# Patient Record
Sex: Female | Born: 1960 | Race: Black or African American | Hispanic: No | Marital: Married | Smoking: Never smoker
Health system: Southern US, Community
[De-identification: ages and names within clinical notes are randomized; demographics above are authoritative.]

## PROBLEM LIST (undated history)

## (undated) DIAGNOSIS — B356 Tinea cruris: Secondary | ICD-10-CM

## (undated) DIAGNOSIS — IMO0002 Reserved for concepts with insufficient information to code with codable children: Secondary | ICD-10-CM

## (undated) DIAGNOSIS — O24419 Gestational diabetes mellitus in pregnancy, unspecified control: Secondary | ICD-10-CM

## (undated) DIAGNOSIS — Z975 Presence of (intrauterine) contraceptive device: Secondary | ICD-10-CM

## (undated) DIAGNOSIS — D649 Anemia, unspecified: Secondary | ICD-10-CM

## (undated) HISTORY — DX: Presence of (intrauterine) contraceptive device: Z97.5

## (undated) HISTORY — DX: Tinea cruris: B35.6

## (undated) HISTORY — DX: Gestational diabetes mellitus in pregnancy, unspecified control: O24.419

## (undated) HISTORY — DX: Anemia, unspecified: D64.9

## (undated) HISTORY — DX: Reserved for concepts with insufficient information to code with codable children: IMO0002

---

## 1987-07-23 DIAGNOSIS — IMO0002 Reserved for concepts with insufficient information to code with codable children: Secondary | ICD-10-CM

## 1987-07-23 DIAGNOSIS — R87619 Unspecified abnormal cytological findings in specimens from cervix uteri: Secondary | ICD-10-CM

## 1987-07-23 HISTORY — DX: Unspecified abnormal cytological findings in specimens from cervix uteri: R87.619

## 1987-07-23 HISTORY — DX: Reserved for concepts with insufficient information to code with codable children: IMO0002

## 1998-06-08 ENCOUNTER — Observation Stay (HOSPITAL_COMMUNITY): Admission: AD | Admit: 1998-06-08 | Discharge: 1998-06-09 | Payer: Self-pay | Admitting: *Deleted

## 1998-07-05 ENCOUNTER — Encounter: Payer: Self-pay | Admitting: Obstetrics & Gynecology

## 1998-07-05 ENCOUNTER — Inpatient Hospital Stay (HOSPITAL_COMMUNITY): Admission: AD | Admit: 1998-07-05 | Discharge: 1998-07-09 | Payer: Self-pay | Admitting: Obstetrics & Gynecology

## 1998-07-09 ENCOUNTER — Encounter: Payer: Self-pay | Admitting: Obstetrics & Gynecology

## 1998-07-31 ENCOUNTER — Inpatient Hospital Stay (HOSPITAL_COMMUNITY): Admission: AD | Admit: 1998-07-31 | Discharge: 1998-08-05 | Payer: Self-pay | Admitting: Obstetrics & Gynecology

## 1998-08-03 ENCOUNTER — Encounter: Payer: Self-pay | Admitting: Obstetrics and Gynecology

## 1998-08-18 ENCOUNTER — Ambulatory Visit (HOSPITAL_COMMUNITY): Admission: RE | Admit: 1998-08-18 | Discharge: 1998-08-18 | Payer: Self-pay | Admitting: Obstetrics & Gynecology

## 1998-09-21 ENCOUNTER — Inpatient Hospital Stay (HOSPITAL_COMMUNITY): Admission: AD | Admit: 1998-09-21 | Discharge: 1998-09-24 | Payer: Self-pay | Admitting: *Deleted

## 1999-04-20 ENCOUNTER — Other Ambulatory Visit: Admission: RE | Admit: 1999-04-20 | Discharge: 1999-04-20 | Payer: Self-pay | Admitting: Obstetrics & Gynecology

## 1999-12-19 ENCOUNTER — Encounter: Payer: Self-pay | Admitting: Obstetrics & Gynecology

## 1999-12-19 ENCOUNTER — Ambulatory Visit (HOSPITAL_COMMUNITY): Admission: RE | Admit: 1999-12-19 | Discharge: 1999-12-19 | Payer: Self-pay | Admitting: Obstetrics & Gynecology

## 2000-04-23 ENCOUNTER — Other Ambulatory Visit: Admission: RE | Admit: 2000-04-23 | Discharge: 2000-04-23 | Payer: Self-pay | Admitting: Obstetrics & Gynecology

## 2001-05-06 ENCOUNTER — Other Ambulatory Visit: Admission: RE | Admit: 2001-05-06 | Discharge: 2001-05-06 | Payer: Self-pay | Admitting: Obstetrics and Gynecology

## 2002-05-10 ENCOUNTER — Other Ambulatory Visit: Admission: RE | Admit: 2002-05-10 | Discharge: 2002-05-10 | Payer: Self-pay | Admitting: Obstetrics and Gynecology

## 2002-05-21 ENCOUNTER — Encounter: Payer: Self-pay | Admitting: Obstetrics and Gynecology

## 2002-05-21 ENCOUNTER — Ambulatory Visit (HOSPITAL_COMMUNITY): Admission: RE | Admit: 2002-05-21 | Discharge: 2002-05-21 | Payer: Self-pay | Admitting: Obstetrics and Gynecology

## 2003-05-16 ENCOUNTER — Other Ambulatory Visit: Admission: RE | Admit: 2003-05-16 | Discharge: 2003-05-16 | Payer: Self-pay | Admitting: Obstetrics and Gynecology

## 2003-05-27 ENCOUNTER — Ambulatory Visit (HOSPITAL_COMMUNITY): Admission: RE | Admit: 2003-05-27 | Discharge: 2003-05-27 | Payer: Self-pay | Admitting: Obstetrics and Gynecology

## 2004-05-15 ENCOUNTER — Other Ambulatory Visit: Admission: RE | Admit: 2004-05-15 | Discharge: 2004-05-15 | Payer: Self-pay | Admitting: Obstetrics and Gynecology

## 2004-05-28 ENCOUNTER — Ambulatory Visit (HOSPITAL_COMMUNITY): Admission: RE | Admit: 2004-05-28 | Discharge: 2004-05-28 | Payer: Self-pay | Admitting: Obstetrics and Gynecology

## 2004-07-22 HISTORY — PX: OTHER SURGICAL HISTORY: SHX169

## 2005-04-24 ENCOUNTER — Encounter: Admission: RE | Admit: 2005-04-24 | Discharge: 2005-04-24 | Payer: Self-pay | Admitting: Internal Medicine

## 2005-04-29 ENCOUNTER — Encounter (INDEPENDENT_AMBULATORY_CARE_PROVIDER_SITE_OTHER): Payer: Self-pay | Admitting: *Deleted

## 2005-04-29 ENCOUNTER — Ambulatory Visit (HOSPITAL_COMMUNITY): Admission: RE | Admit: 2005-04-29 | Discharge: 2005-04-30 | Payer: Self-pay | Admitting: *Deleted

## 2005-05-17 ENCOUNTER — Other Ambulatory Visit: Admission: RE | Admit: 2005-05-17 | Discharge: 2005-05-17 | Payer: Self-pay | Admitting: Obstetrics and Gynecology

## 2005-05-29 ENCOUNTER — Ambulatory Visit (HOSPITAL_COMMUNITY): Admission: RE | Admit: 2005-05-29 | Discharge: 2005-05-29 | Payer: Self-pay | Admitting: Obstetrics and Gynecology

## 2005-06-20 ENCOUNTER — Encounter: Admission: RE | Admit: 2005-06-20 | Discharge: 2005-06-20 | Payer: Self-pay | Admitting: Obstetrics and Gynecology

## 2005-10-10 ENCOUNTER — Encounter: Admission: RE | Admit: 2005-10-10 | Discharge: 2005-10-10 | Payer: Self-pay | Admitting: Obstetrics and Gynecology

## 2006-05-20 ENCOUNTER — Other Ambulatory Visit: Admission: RE | Admit: 2006-05-20 | Discharge: 2006-05-20 | Payer: Self-pay | Admitting: Obstetrics and Gynecology

## 2006-06-03 ENCOUNTER — Ambulatory Visit (HOSPITAL_COMMUNITY): Admission: RE | Admit: 2006-06-03 | Discharge: 2006-06-03 | Payer: Self-pay | Admitting: Obstetrics and Gynecology

## 2007-06-05 ENCOUNTER — Ambulatory Visit (HOSPITAL_COMMUNITY): Admission: RE | Admit: 2007-06-05 | Discharge: 2007-06-05 | Payer: Self-pay | Admitting: Obstetrics and Gynecology

## 2008-06-07 ENCOUNTER — Ambulatory Visit (HOSPITAL_COMMUNITY): Admission: RE | Admit: 2008-06-07 | Discharge: 2008-06-07 | Payer: Self-pay | Admitting: Obstetrics and Gynecology

## 2009-06-08 ENCOUNTER — Ambulatory Visit (HOSPITAL_COMMUNITY): Admission: RE | Admit: 2009-06-08 | Discharge: 2009-06-08 | Payer: Self-pay | Admitting: Obstetrics and Gynecology

## 2010-06-13 ENCOUNTER — Ambulatory Visit (HOSPITAL_COMMUNITY)
Admission: RE | Admit: 2010-06-13 | Discharge: 2010-06-13 | Payer: Self-pay | Source: Home / Self Care | Admitting: Obstetrics and Gynecology

## 2010-08-11 ENCOUNTER — Encounter: Payer: Self-pay | Admitting: Obstetrics and Gynecology

## 2010-08-12 ENCOUNTER — Encounter: Payer: Self-pay | Admitting: Obstetrics and Gynecology

## 2010-12-07 NOTE — Op Note (Signed)
[name], [name]                ACCOUNT NO.:  [id number]   MEDICAL RECORD NO.:  [id number]          PATIENT TYPE:  OIB   LOCATION:  1511                         FACILITY:  [facility]   PHYSICIAN:  Vikki Ports, MDDATE OF BIRTH:  05-06-1961   DATE OF PROCEDURE:  04/29/2005  DATE OF DISCHARGE:                                 OPERATIVE REPORT   PREOPERATIVE DIAGNOSIS:  Acute cholecystitis.   POSTOPERATIVE DIAGNOSIS:  Acute cholecystitis.   PROCEDURE:  Laparoscopic cholecystectomy with intraoperative cholangiogram.   FINDINGS:  Normal cholangiogram, extremely significant acute cholecystitis  with areas of patchy gangrene.   SURGEON:  Vikki Ports, MD   ASSISTANT:  Gita Kudo, M.D.   ANESTHESIA:  General.   DESCRIPTION:  Patient was taken to the operating room and placed in the  supine position.  After adequate general anesthesia was induced using the  endotracheal tube, the abdomen was prepped and draped in a normal sterile  fashion.  Using a transverse infraumbilical incision, I dissected down to  the fascia.  The fascia was opened vertically, and an 0 Vicryl purse-string  suture was placed around the fascial defect.  Pneumoperitoneum was obtained.  Under direct vision, additional 10 mm trocar was placed in the subxiphoid  region, and two 5 mm trocars were placed in the right abdomen.  The  gallbladder was very, very edematous; therefore, was aspirated.  The 5 mm  trocar in the right abdomen was replaced with a 10 mm so I could use a more  aggressive grasper to hold the gallbladder.  Dissection at the neck was  tedious but was carefully performed and visualized the cystic duct.  It was  clipped on the gallbladder.  A ductotomy was made.  A cholangiogram was  performed.  It showed no filling defects and normal filling of the common  duct and bilateral hepatic ducts.  Cystic duct was then triply clipped and  divided, as was the cystic artery.  The gallbladder  was taken off the  gallbladder bed using Bovie electrocautery.  There was significant  inflammation and a significant amount of bleeding from the liver bed.  This  was controlled with electrocautery and with Surgicel.  Because of the  necrotic nature of the gallbladder and cystic duct.  I placed a drain  through the lateral port.  Adequate hemostasis was insured.  The right upper  quadrant was copiously irrigated.  Of note, the fascial defect had to be  increased in size to remove the gallbladder, and the gallbladder was opened  to remove numerous stones in order to remove the gallbladder to the  umbilical incision.  An additional suture was  placed of 0 Vicryl to close the fascial defect.  Skin incisions were closed  with subcuticular 4-0 Monocryl.  Steri-Strips and sterile dressings were  applied.  The patient tolerated the procedure well and went to the PACU in  good condition.      Vikki Ports, MD  Electronically Signed     KRH/MEDQ  D:  04/29/2005  T:  04/29/2005  Job:  [id number]

## 2010-12-07 NOTE — H&P (Signed)
NAMEMEGYN, LENG                ACCOUNT NO.:  [id number]   MEDICAL RECORD NO.:  [id number]          PATIENT TYPE:  AMB   LOCATION:  SDC                           FACILITY:  [facility]   PHYSICIAN:  Hal Morales, M.D.DATE OF BIRTH:  1961/06/17   DATE OF ADMISSION:  DATE OF DISCHARGE:                                HISTORY & PHYSICAL   HISTORY OF PRESENT ILLNESS:  Ms. Hashimi is a 50 year old married African-  American female, para 2-0-0-2, who presents for permanent sterilization.  The patient's last menstrual period was March 23, 2004, which is  attributed to the fact of the patient having a Jearld Adjutant IUD in place.  The  patient is certain that she does not want any more children, and understands  that the sterilization procedure is regarded as permanent.   OBSTETRICAL HISTORY:  Gravida 2, para 2-0-0-2.  The patient had gestational  diabetes and a cesarean section on two occasions.   GYNECOLOGIC HISTORY:  Menarche at 50 years old.  The patient's menstrual  period prior to insertion of the IUD was monthly.  Her last menstrual period  was March 23, 2004.  She uses a [place] IUD as her method of  contraception.  The patient has a remote history of an abnormal Pap smear,  for which she received cryosurgery (1989).  Denies any history of sexually  transmitted diseases.  Had a normal Pap smear in October of 2005, and a  normal mammogram in November of 2005.   MEDICAL HISTORY:  Negative.   SURGICAL HISTORY:  Negative, except for cesarean sections.  The patient  received a blood transfusion in 2000.  The patient denies any problems with  anesthesia.   FAMILY HISTORY:  Positive for noninsulin-dependent diabetes mellitus,  hypertension.   SOCIAL HISTORY:  The patient is married, and she works as a Theatre manager.   HABITS:  The patient does not use tobacco or alcohol.   MEDICATIONS:  1.  Multivitamin one tablet daily.  2.  Vitamin C one tablet daily.  3.  Fruits and Veggies one  tablet daily.  4.  Calcium 1000 mg daily.  5.  Omega-3 EFA one tablet daily.   ALLERGIES:  No known drug allergies.   REVIEW OF SYSTEMS:  Negative.   PHYSICAL EXAMINATION:  VITAL SIGNS:  Blood pressure 118/70, weight 168,  height 5 feet, 2-1/4 inches tall.  NECK:  Supple.  There are no masses, thyromegaly, or adenopathy.  HEART:  Regular rate and rhythm.  There is no murmur.  LUNGS:  Clear to auscultation.  There are no wheezing, rales, or rhonchi.  BACK:  No CVA tenderness.  ABDOMEN:  Bowel sounds are present.  It is soft without tenderness,  guarding, rebound, or organomegaly.  EXTREMITIES:  Without clubbing, cyanosis, or edema.  PELVIC:  EGBUS is within normal limits.  Vagina is rugose.  Cervix is  nontender without lesions.  The patient does have an IUD string that is  visible at the os.  Uterus is normal size, shape, and consistency.  Adnexa  without tenderness or masses.  RECTOVAGINAL:  Without tenderness  or masses.  The patient does have a small  hemorrhoid.   LABORATORY DATA:  Wet prep revealed a pH of 4.5.  Negative whiff.  Positive  for yeast.   ASSESSMENT:  1.  Desire for permanent sterilization.  2.  Candida vaginitis.   DISPOSITION:  A discussion was held with the patient regarding the  laparoscopic procedure for tubal sterilization, and the patient verbalized  understanding of its risks, which include, but are not limited to, reaction  to anesthesia, damage to adjacent organs, infection, and excessive bleeding.  Additionally, the patient was given Diflucan 150 mg one tablet to take p.o.  once for her yeast vaginitis.  She has subsequently been scheduled to  undergo a laparoscopic tubal cautery at [facility] [place] on  October 18, 2004 at 10:30 a.m.      EJP/MEDQ  D:  10/16/2004  T:  10/16/2004  Job:  [id number]

## 2011-06-17 ENCOUNTER — Other Ambulatory Visit (HOSPITAL_COMMUNITY): Payer: Self-pay | Admitting: Obstetrics and Gynecology

## 2011-06-17 DIAGNOSIS — Z1231 Encounter for screening mammogram for malignant neoplasm of breast: Secondary | ICD-10-CM

## 2011-07-18 ENCOUNTER — Ambulatory Visit (HOSPITAL_COMMUNITY)
Admission: RE | Admit: 2011-07-18 | Discharge: 2011-07-18 | Disposition: A | Discharge status: Home / Self Care | Payer: 59 | Source: Ambulatory Visit | Attending: Obstetrics and Gynecology | Admitting: Obstetrics and Gynecology

## 2011-07-18 DIAGNOSIS — Z1231 Encounter for screening mammogram for malignant neoplasm of breast: Secondary | ICD-10-CM | POA: Insufficient documentation

## 2012-05-21 ENCOUNTER — Ambulatory Visit (INDEPENDENT_AMBULATORY_CARE_PROVIDER_SITE_OTHER): Payer: 59 | Admitting: Obstetrics and Gynecology

## 2012-05-21 ENCOUNTER — Encounter: Payer: Self-pay | Admitting: Obstetrics and Gynecology

## 2012-05-21 VITALS — BP 130/78 | Ht 62.5 in | Wt 183.0 lb

## 2012-05-21 DIAGNOSIS — Z124 Encounter for screening for malignant neoplasm of cervix: Secondary | ICD-10-CM

## 2012-05-21 DIAGNOSIS — B356 Tinea cruris: Secondary | ICD-10-CM

## 2012-05-21 DIAGNOSIS — Z01419 Encounter for gynecological examination (general) (routine) without abnormal findings: Secondary | ICD-10-CM

## 2012-05-21 MED ORDER — NYSTATIN-TRIAMCINOLONE 100000-0.1 UNIT/GM-% EX OINT: TOPICAL_OINTMENT | Freq: Two times a day (BID) | CUTANEOUS | Status: AC

## 2012-05-21 NOTE — Patient Instructions (Addendum)
   Once you have used the cream for your rash for 14 days, then begin to use Zeasorb (OTC) powder to help to keep area dry and lessen friction.  Yeast Infection of the Skin Some yeast on the skin is normal, but sometimes it causes an infection. If you have a yeast infection, it shows up as white or light brown patches on brown skin. You can see it better in the summer on tan skin. It causes light-colored holes in your suntan. It can happen on any area of the body. This cannot be passed from person to person. HOME CARE  Scrub your skin daily with a dandruff shampoo. Your rash may take a couple weeks to get well.  Do not scratch or itch the rash. GET HELP RIGHT AWAY IF:   You get another infection from scratching. The skin may get warm, red, and may ooze fluid.  The infection does not seem to be getting better. MAKE SURE YOU:  Understand these instructions.  Will watch your condition.  Will get help right away if you are not doing well or get worse. Document Released: 06/20/2008 Document Revised: 09/30/2011 Document Reviewed: 06/20/2008 [facility] Patient Information 2013 [place], [place].

## 2012-05-21 NOTE — Progress Notes (Signed)
Regular Periods: no Mammogram: yes  Monthly Breast Ex.: yes Exercise: yes  Tetanus < 10 years: no Seatbelts: yes  NI. Bladder Functn.: yes Abuse at home: no  Daily BM's: yes Stressful Work: no  Healthy Diet: yes Sigmoid-Colonoscopy: no  Calcium: yes noneMedical problems this year: C/O ITCHING IN PUBIC AREA   LAST PAP:8/12  Contraception: IUD  Mammogram:  12/13  PCP: DR. PLACEY  PMH: NO CHANGE  FMH: NO CHANGE  Last Bone Scan: NO  PT IS MARRIED

## 2012-05-21 NOTE — Progress Notes (Signed)
Subjective:    Carla Hughes is a 51 y.o. female, G2P0, who presents for an annual exam. The patient reports itchiness in pubic area.x several weeks.  Denies any rash, vaginal discharge.   Menstrual cycle:   LMP: No LMP recorded. Patient is not currently having periods (Reason: IUD).             Review of Systems Pertinent items are noted in HPI. Denies pelvic pain, urinary tract symptoms, vaginitis symptoms, irregular bleeding, menopausal symptoms, change in bowel habits or rectal bleeding   Objective:    BP 130/78  Ht 5' 2.5" (1.588 m)  Wt 183 lb (83.008 kg)  BMI 32.94 kg/m2   :  Wt Readings from Last 1 Encounters:  05/21/12 183 lb (83.008 kg)   Body mass index is 32.94 kg/(m^2). General Appearance: Alert, no acute distress HEENT: Grossly normal Neck / Thyroid: Supple, no thyromegaly or cervical adenopathy Lungs: Clear to auscultation bilaterally Back: No CVA tenderness Breast Exam: No masses or nodes.No dimpling, nipple retraction or discharge. Cardiovascular: Regular rate and rhythm.  Gastrointestinal: Soft, non-tender, no masses or organomegaly Pelvic Exam: EGBUS-with well circumscribed pigmented rash in both, crural folds, vagina-normal rugae, cervix- without lesions or tenderness, string visible uterus appears normal size shape and consistency, adnexae-no masses or tenderness Rectovaginal: no masses and normal sphincter tone Lymphatic Exam: Non-palpable nodes in neck, clavicular,  axillary, or inguinal regions  Skin: no rashes or abnormalities Extremities: no clubbing cyanosis or edema  Neurologic: grossly normal Psychiatric: Alert and oriented  Assessment:   Routine GYN Exam IUD Expires 12/27/2012 Tinea Cruris   Plan:    PAP sent  RTO 1 year or prn  Mycolog II  30 grams apply to afffected area bid x 14 days,  then use OTC,  Zeasorb Powder to  prevent recurrence  Hollynn Garno,ELMIRAPA-C

## 2012-05-22 LAB — PAP IG W/ RFLX HPV ASCU

## 2012-06-26 ENCOUNTER — Other Ambulatory Visit: Payer: Self-pay | Admitting: Obstetrics and Gynecology

## 2012-06-26 DIAGNOSIS — Z1231 Encounter for screening mammogram for malignant neoplasm of breast: Secondary | ICD-10-CM

## 2012-07-20 ENCOUNTER — Ambulatory Visit (HOSPITAL_BASED_OUTPATIENT_CLINIC_OR_DEPARTMENT_OTHER)
Admission: RE | Admit: 2012-07-20 | Discharge: 2012-07-20 | Disposition: A | Discharge status: Home / Self Care | Payer: 59 | Source: Ambulatory Visit | Attending: Obstetrics and Gynecology | Admitting: Obstetrics and Gynecology

## 2012-07-20 DIAGNOSIS — Z1231 Encounter for screening mammogram for malignant neoplasm of breast: Secondary | ICD-10-CM | POA: Insufficient documentation

## 2012-07-22 ENCOUNTER — Encounter: Payer: Self-pay | Admitting: Obstetrics and Gynecology

## 2012-07-22 DIAGNOSIS — R922 Inconclusive mammogram: Secondary | ICD-10-CM | POA: Insufficient documentation

## 2012-07-23 ENCOUNTER — Encounter: Payer: Self-pay | Admitting: Obstetrics and Gynecology

## 2013-01-27 ENCOUNTER — Encounter: Payer: Self-pay | Admitting: Obstetrics and Gynecology

## 2013-06-21 ENCOUNTER — Other Ambulatory Visit: Payer: Self-pay | Admitting: Obstetrics and Gynecology

## 2013-06-21 DIAGNOSIS — Z1231 Encounter for screening mammogram for malignant neoplasm of breast: Secondary | ICD-10-CM

## 2013-07-21 ENCOUNTER — Inpatient Hospital Stay (HOSPITAL_BASED_OUTPATIENT_CLINIC_OR_DEPARTMENT_OTHER): Admission: RE | Admit: 2013-07-21 | Payer: 59 | Source: Ambulatory Visit

## 2013-07-26 ENCOUNTER — Ambulatory Visit (HOSPITAL_BASED_OUTPATIENT_CLINIC_OR_DEPARTMENT_OTHER)
Admission: RE | Admit: 2013-07-26 | Discharge: 2013-07-26 | Disposition: A | Discharge status: Home / Self Care | Payer: 59 | Source: Ambulatory Visit | Attending: Obstetrics and Gynecology | Admitting: Obstetrics and Gynecology

## 2013-07-26 DIAGNOSIS — Z1231 Encounter for screening mammogram for malignant neoplasm of breast: Secondary | ICD-10-CM

## 2014-05-23 ENCOUNTER — Encounter: Payer: Self-pay | Admitting: Obstetrics and Gynecology

## 2014-06-29 ENCOUNTER — Other Ambulatory Visit (HOSPITAL_BASED_OUTPATIENT_CLINIC_OR_DEPARTMENT_OTHER): Payer: Self-pay | Admitting: Obstetrics and Gynecology

## 2014-06-29 DIAGNOSIS — Z1231 Encounter for screening mammogram for malignant neoplasm of breast: Secondary | ICD-10-CM

## 2014-07-29 ENCOUNTER — Ambulatory Visit (HOSPITAL_BASED_OUTPATIENT_CLINIC_OR_DEPARTMENT_OTHER)
Admission: RE | Admit: 2014-07-29 | Discharge: 2014-07-29 | Disposition: A | Discharge status: Home / Self Care | Payer: 59 | Source: Ambulatory Visit | Attending: Obstetrics and Gynecology | Admitting: Obstetrics and Gynecology

## 2014-07-29 DIAGNOSIS — Z1231 Encounter for screening mammogram for malignant neoplasm of breast: Secondary | ICD-10-CM | POA: Diagnosis present

## 2015-07-04 ENCOUNTER — Other Ambulatory Visit (HOSPITAL_BASED_OUTPATIENT_CLINIC_OR_DEPARTMENT_OTHER): Payer: Self-pay | Admitting: Obstetrics and Gynecology

## 2015-07-04 DIAGNOSIS — Z1231 Encounter for screening mammogram for malignant neoplasm of breast: Secondary | ICD-10-CM

## 2015-07-31 ENCOUNTER — Ambulatory Visit (HOSPITAL_BASED_OUTPATIENT_CLINIC_OR_DEPARTMENT_OTHER)
Admission: RE | Admit: 2015-07-31 | Discharge: 2015-07-31 | Disposition: A | Discharge status: Home / Self Care | Payer: 59 | Source: Ambulatory Visit | Attending: Obstetrics and Gynecology | Admitting: Obstetrics and Gynecology

## 2015-07-31 DIAGNOSIS — Z1231 Encounter for screening mammogram for malignant neoplasm of breast: Secondary | ICD-10-CM | POA: Diagnosis not present

## 2016-06-25 ENCOUNTER — Other Ambulatory Visit (HOSPITAL_BASED_OUTPATIENT_CLINIC_OR_DEPARTMENT_OTHER): Payer: Self-pay | Admitting: Obstetrics and Gynecology

## 2016-06-25 DIAGNOSIS — Z1231 Encounter for screening mammogram for malignant neoplasm of breast: Secondary | ICD-10-CM

## 2016-08-06 ENCOUNTER — Ambulatory Visit (HOSPITAL_BASED_OUTPATIENT_CLINIC_OR_DEPARTMENT_OTHER)
Admission: RE | Admit: 2016-08-06 | Discharge: 2016-08-06 | Disposition: A | Discharge status: Home / Self Care | Payer: 59 | Source: Ambulatory Visit | Attending: Obstetrics and Gynecology | Admitting: Obstetrics and Gynecology

## 2016-08-06 DIAGNOSIS — Z1231 Encounter for screening mammogram for malignant neoplasm of breast: Secondary | ICD-10-CM | POA: Insufficient documentation

## 2017-05-08 IMAGING — MG DIGITAL SCREENING BILATERAL MAMMOGRAM WITH CAD
4 series · 4 of 4 positions shown · non-contrast
Comparison: Previous exam(s).

CLINICAL DATA: Screening.

EXAM:
DIGITAL SCREENING BILATERAL MAMMOGRAM WITH CAD

[L CC]
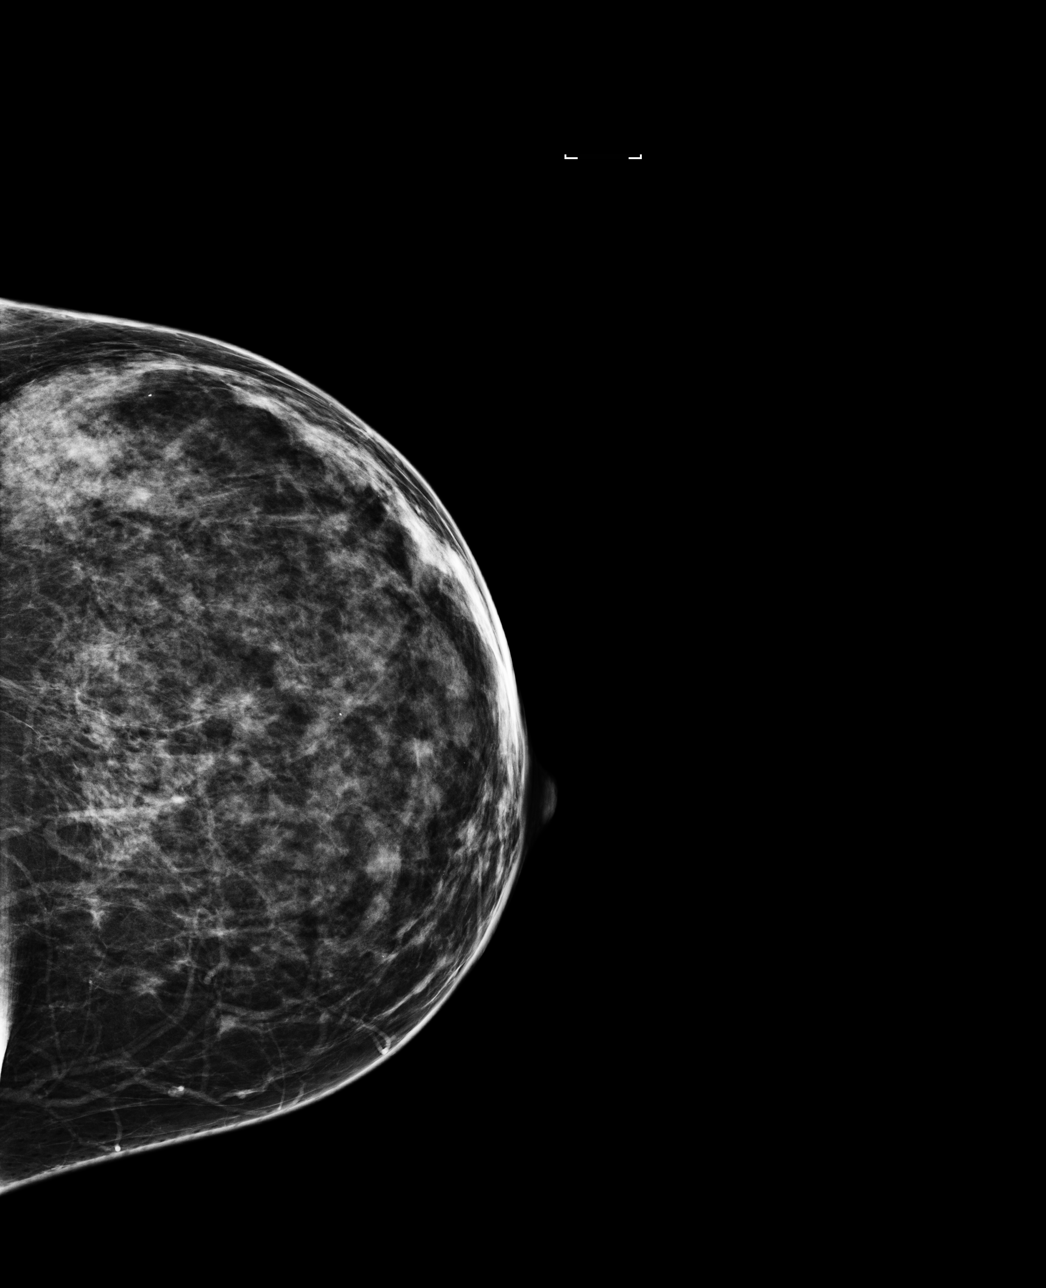

[R MLO]
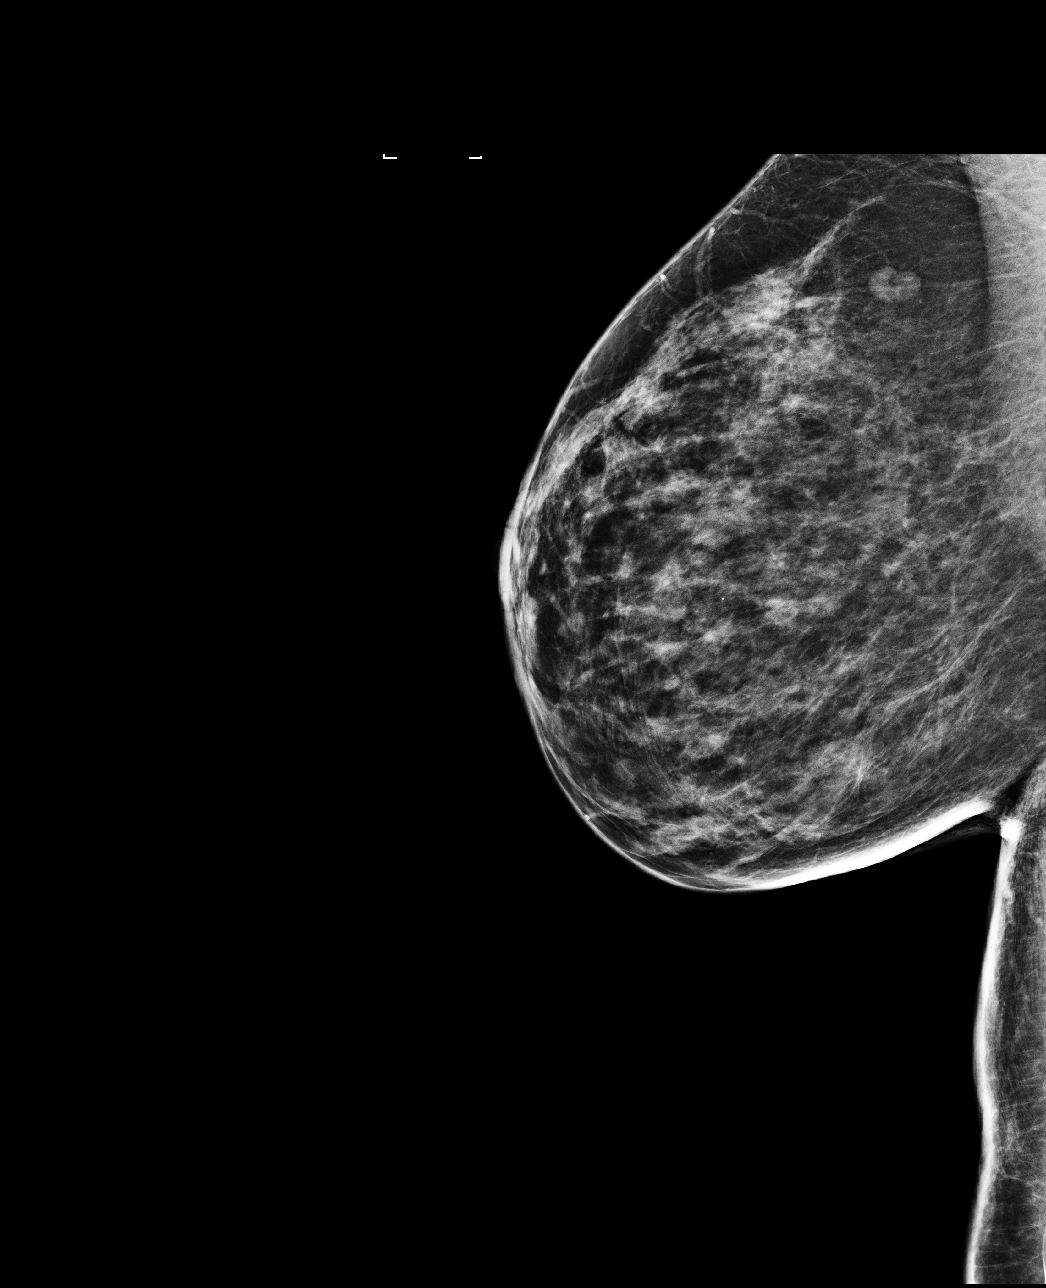

[R CC]
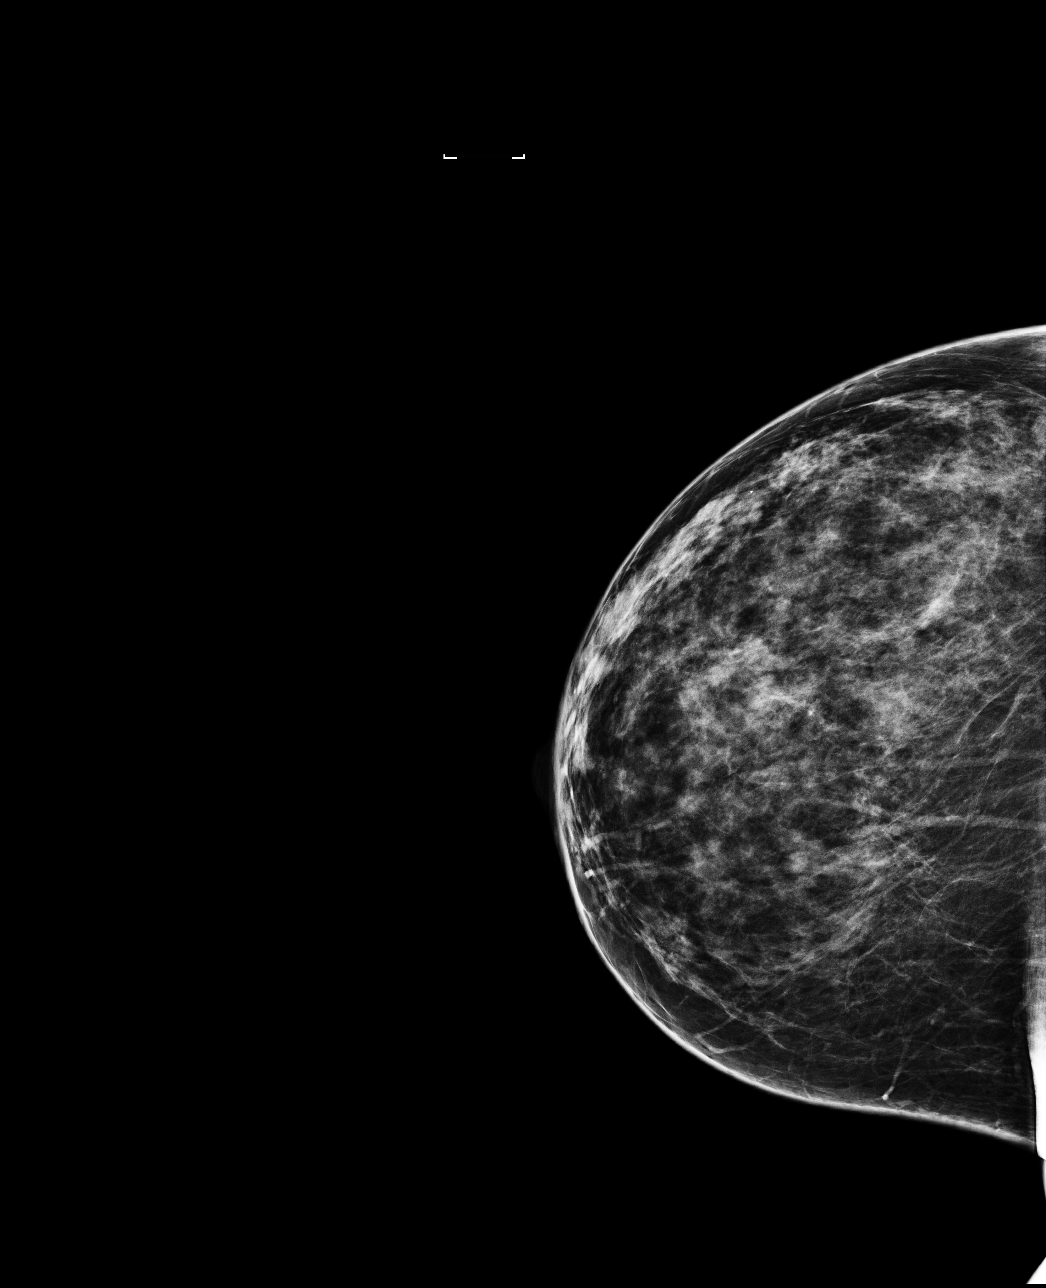

[L MLO]
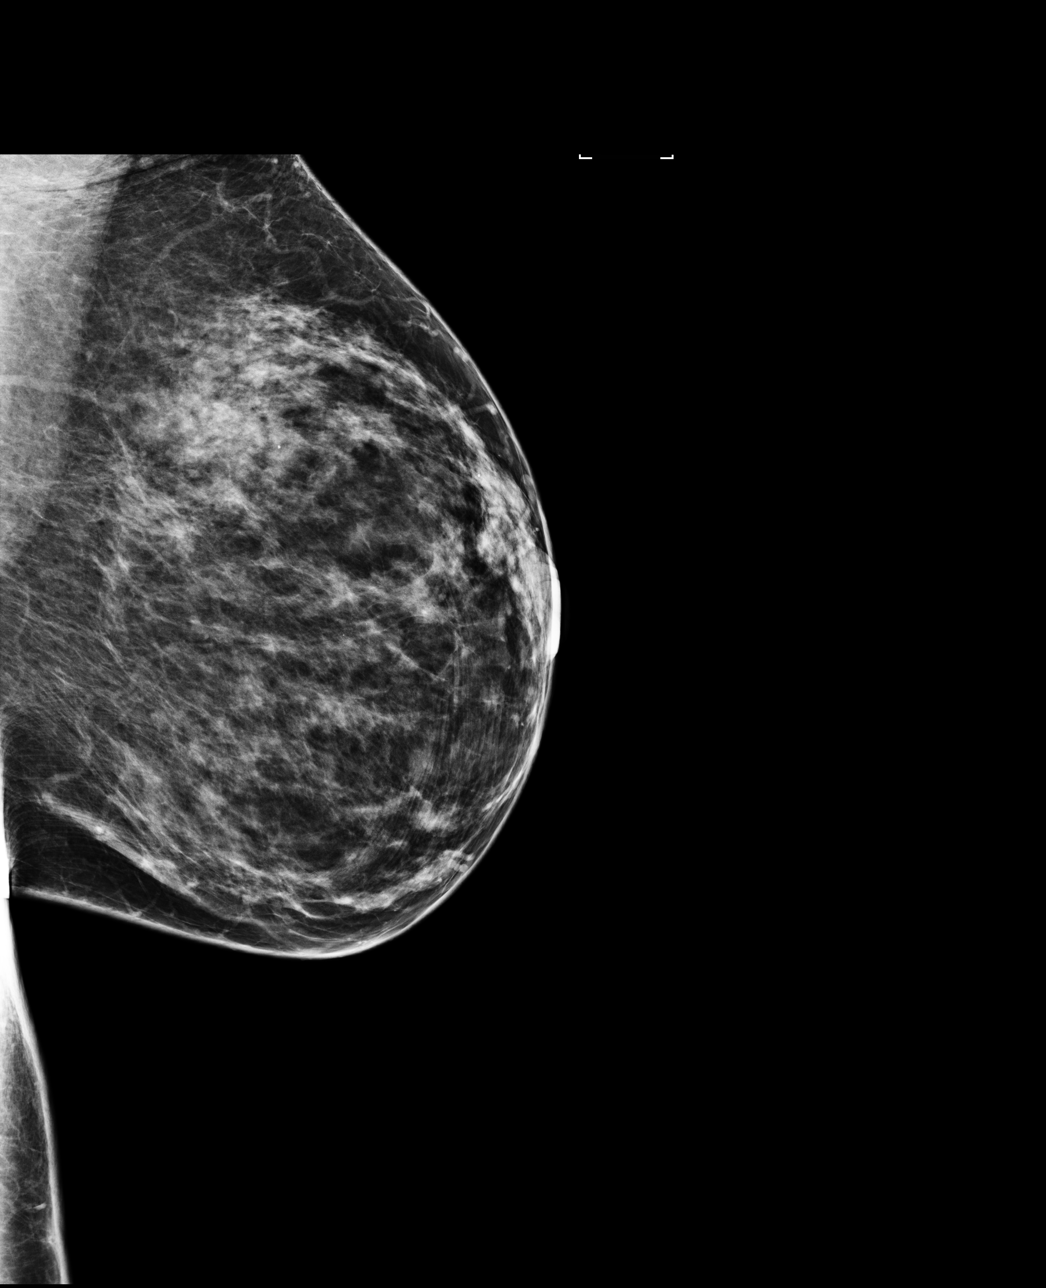

[4 of 4 positions shown; findings below may reference images not displayed]

ACR Breast Density Category c: The breast tissue is heterogeneously
dense, which may obscure small masses.
FINDINGS: There are no findings suspicious for malignancy. Images were
processed with CAD.
IMPRESSION: No mammographic evidence of malignancy. A result letter of this
screening mammogram will be mailed directly to the patient.

RECOMMENDATION:
Screening mammogram in one year. (Code:[id number])

BI-RADS CATEGORY  1: Negative.

## 2021-12-24 ENCOUNTER — Ambulatory Visit (HOSPITAL_COMMUNITY)
Admission: EM | Admit: 2021-12-24 | Discharge: 2021-12-24 | Disposition: A | Discharge status: Home / Self Care | Payer: 59 | Attending: Physician Assistant | Admitting: Physician Assistant

## 2021-12-24 ENCOUNTER — Encounter (HOSPITAL_COMMUNITY): Payer: Self-pay | Admitting: Emergency Medicine

## 2021-12-24 ENCOUNTER — Ambulatory Visit (INDEPENDENT_AMBULATORY_CARE_PROVIDER_SITE_OTHER): Payer: 59

## 2021-12-24 ENCOUNTER — Other Ambulatory Visit: Payer: Self-pay

## 2021-12-24 DIAGNOSIS — M25511 Pain in right shoulder: Secondary | ICD-10-CM

## 2021-12-24 DIAGNOSIS — D167 Benign neoplasm of ribs, sternum and clavicle: Secondary | ICD-10-CM | POA: Diagnosis not present

## 2021-12-24 NOTE — ED Triage Notes (Signed)
Woke yesterday morning with a knot on right clavicle/sternum.  No known injury.  Area is hard.  Pcp unable to see today.  Denies pain.  Patient has never noticed this area before yesterday

## 2021-12-24 NOTE — Discharge Instructions (Addendum)
Advised patient to follow-up with PCP for further evaluation and possible additional evaluation studies.

## 2021-12-24 NOTE — ED Provider Notes (Signed)
[facility]    CSN: [id number] Arrival date & time: 12/24/21  4270      History   Chief Complaint No chief complaint on file.   HPI Carla Hughes is a 61 y.o. female.   61 year old female presents with nodule area on the right clavicle.  Patient relates that she awakened yesterday morning and noticed that she had an enlargement on her right clavicle, medial aspect.  Patient relates that she noticed the area was larger than the left side, she relates that it is not tender or painful, but she has never noticed this being present before.  Patient relates she has not had any trauma to the area, she relates as she can recall she is never broken the medial aspect of her clavicle.  Patient relates that she is not having any problems with shortness of breath, she relates that she has not had any upper extremity numbness, tingling, or weakness of either extremity.  Patient relates that she is breathing normally, with no shortness of breath, she has a normal appetite and drinking fluids well.    Past Medical History:  Diagnosis Date   Abnormal Pap smear 1989   cryotherapy   Anemia    Gestational diabetes 1998 & 2000   IUD (intrauterine device) in place    Tinea cruris     Patient Active Problem List   Diagnosis Date Noted   Dense breasts 07/22/2012    Past Surgical History:  Procedure Laterality Date   cholecystectomy  2006    OB History     Gravida  2   Para  2   Term      Preterm  1   AB      Living  2      SAB      IAB      Ectopic      Multiple      Live Births               Home Medications    Prior to Admission medications   Medication Sig Start Date End Date Taking? Authorizing Provider  calcium carbonate 200 MG capsule Take 250 mg by mouth 2 (two) times daily with a meal. Patient not taking: Reported on 12/24/2021    [provider]  Multiple Vitamin (MULTIVITAMIN) tablet Take 1 tablet by mouth daily. Patient not taking:  Reported on 12/24/2021    [provider]  nystatin-triamcinolone ointment (MYCOLOG) Apply topically 2 (two) times daily. x 14 days Patient not taking: Reported on 12/24/2021 05/21/12   Earnstine Regal, PA-C    Family History Family History  Problem Relation Age of Onset   Hypertension Mother    Diabetes Father     Social History Social History   Tobacco Use   Smoking status: Never   Smokeless tobacco: Never  Vaping Use   Vaping Use: Never used  Substance Use Topics   Alcohol use: No   Drug use: No     Allergies   Patient has no known allergies.   Review of Systems Review of Systems  Musculoskeletal:  Positive for joint swelling (right medial clavicle prominence) and neck pain.    Physical Exam Triage Vital Signs ED Triage Vitals  Enc Vitals Group     BP 12/24/21 1129 (!) 159/87     Pulse Rate 12/24/21 1129 67     Resp 12/24/21 1129 18     Temp 12/24/21 1129 98.4 F (36.9 C)  Temp Source 12/24/21 1129 Oral     SpO2 12/24/21 1129 98 %     Weight --      Height --      Head Circumference --      Peak Flow --      Pain Score 12/24/21 1127 0     Pain Loc --      Pain Edu? --      Excl. in [place]? --    No data found.  Updated Vital Signs BP (!) 159/87 (BP Location: Left Arm) Comment (BP Location): large cuff  Pulse 67   Temp 98.4 F (36.9 C) (Oral)   Resp 18   SpO2 98%   Visual Acuity Right Eye Distance:   Left Eye Distance:   Bilateral Distance:    Right Eye Near:   Left Eye Near:    Bilateral Near:     Physical Exam Constitutional:      Appearance: Normal appearance.  Neck:     Comments: Neck: Thyroid is palpated and it is normal without any masses bilaterally. Cardiovascular:     Rate and Rhythm: Normal rate and regular rhythm.     Heart sounds: Normal heart sounds.  Pulmonary:     Effort: Pulmonary effort is normal.     Breath sounds: Normal breath sounds and air entry. No wheezing, rhonchi or rales.  Musculoskeletal:      Comments: Anterior chest wall: There is a enlarged prominence of the medial aspect of the clavicle with and if it attaches to the sternum.  This is as comparing it to the left clavicle insertion area.  The prominence is about 3 cm x 2-1/2 cm, there is no tenderness on palpation, there is no redness, it seems to be part of the bony attachment to the sternum.  Lymphadenopathy:     Cervical: No cervical adenopathy.  Neurological:     Mental Status: She is alert.     UC Treatments / Results  Labs (all labs ordered are listed, but only abnormal results are displayed) Labs Reviewed - No data to display  EKG   Radiology DG Clavicle Right  Result Date: 12/24/2021 CLINICAL DATA:  Prominence of medial RIGHT clavicle, hardened enlargement versus LEFT EXAM: RIGHT CLAVICLE - 2+ VIEWS COMPARISON:  None FINDINGS: Sternoclavicular and acromioclavicular joint alignments normal. Osseous mineralization normal. No fracture, dislocation, or bone destruction. Atherosclerotic calcifications aortic arch. IMPRESSION: No RIGHT clavicular abnormalities. Aortic Atherosclerosis (ICD10-I70.0). Electronically Signed   By: Lavonia Dana M.D.   On: 12/24/2021 12:10    Procedures Procedures (including critical care time)  Medications Ordered in UC Medications - No data to display  Initial Impression / Assessment and Plan / UC Course  I have reviewed the triage vital signs and the nursing notes.  Pertinent labs & imaging results that were available during my care of the patient were reviewed by me and considered in my medical decision making (see chart for details).    Plan: 1.  Advised patient to follow-up with PCP for further evaluation the prominence of the medial right clavicle. (Consider CT-Scan of the area) Final Clinical Impressions(s) / UC Diagnoses   Final diagnoses:  Benign neoplasm of right clavicle     Discharge Instructions      Advised patient to follow-up with PCP for further evaluation and  possible additional evaluation studies.     ED Prescriptions   None    PDMP not reviewed this encounter.   Nyoka Lint, PA-C 12/24/21 1221

## 2022-09-25 IMAGING — DX DG CLAVICLE*R*
2 series · 2 of 2 positions shown · non-contrast
Comparison: None

CLINICAL DATA: Prominence of medial RIGHT clavicle, hardened
enlargement versus LEFT

EXAM:
RIGHT CLAVICLE - 2+ VIEWS

[clavicle ap]
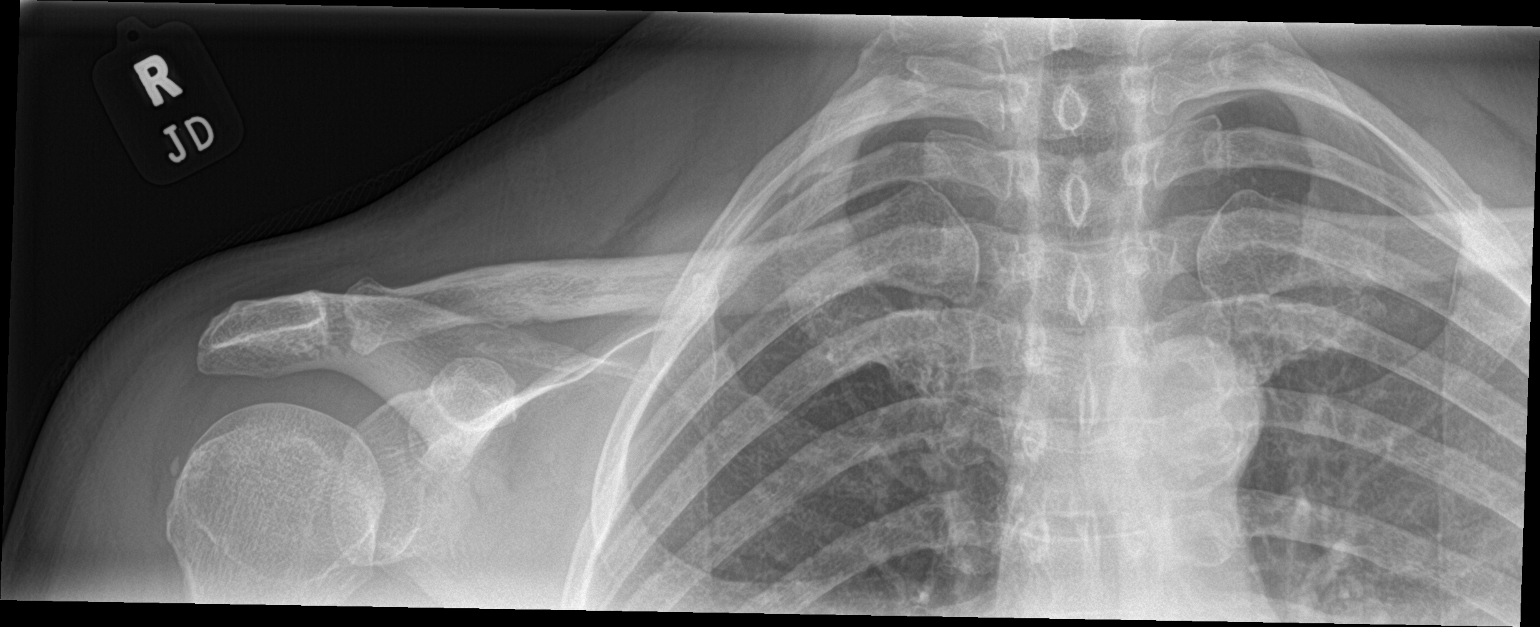

[clavicle axial]
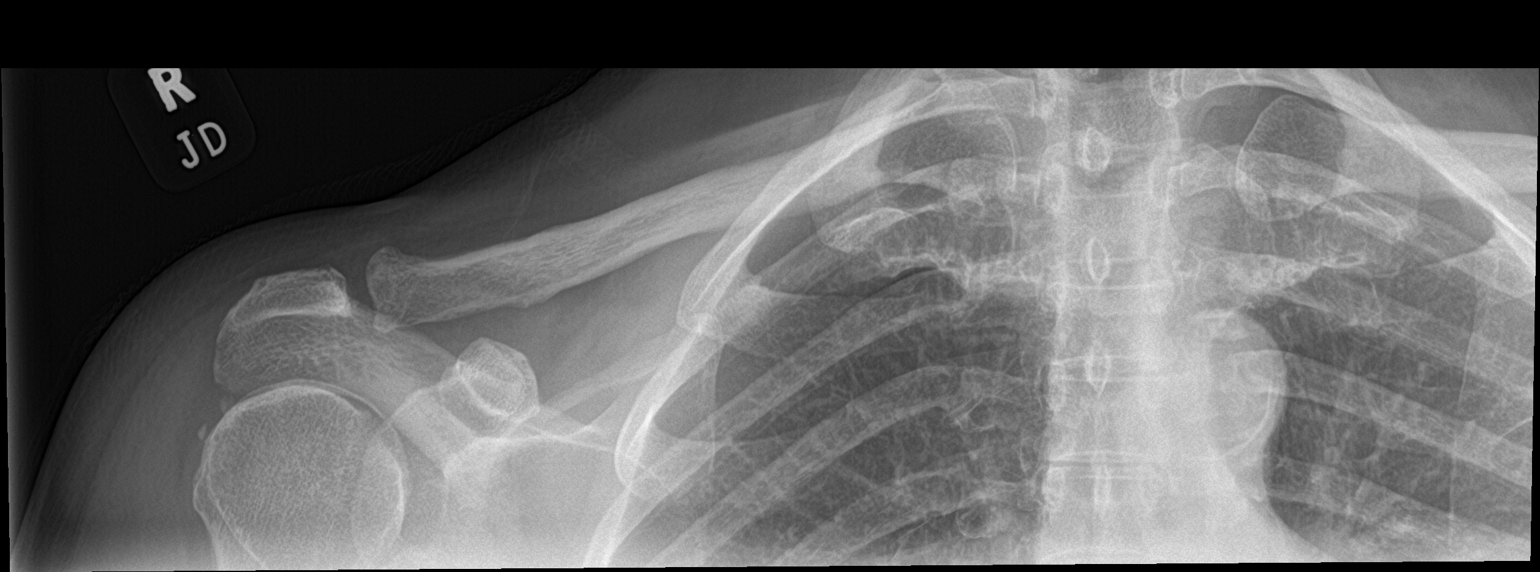

[2 of 2 positions shown; findings below may reference images not displayed]

FINDINGS: Sternoclavicular and acromioclavicular joint alignments normal.

Osseous mineralization normal.

No fracture, dislocation, or bone destruction.

Atherosclerotic calcifications aortic arch.
IMPRESSION: No RIGHT clavicular abnormalities.

Aortic Atherosclerosis ([email or web address]).

## 2023-10-21 ENCOUNTER — Other Ambulatory Visit: Payer: Self-pay | Admitting: Obstetrics and Gynecology

## 2023-10-21 DIAGNOSIS — Z1231 Encounter for screening mammogram for malignant neoplasm of breast: Secondary | ICD-10-CM

## 2023-10-29 ENCOUNTER — Ambulatory Visit
Admission: RE | Admit: 2023-10-29 | Discharge: 2023-10-29 | Disposition: A | Discharge status: Home / Self Care | Source: Ambulatory Visit | Attending: Obstetrics and Gynecology | Admitting: Obstetrics and Gynecology

## 2023-10-29 DIAGNOSIS — Z1231 Encounter for screening mammogram for malignant neoplasm of breast: Secondary | ICD-10-CM
